# Patient Record
Sex: Male | Born: 1990 | Race: Black or African American | Hispanic: No | Marital: Single | Smoking: Never smoker
Health system: Southern US, Community
[De-identification: ages and names within clinical notes are randomized; demographics above are authoritative.]

---
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
## 2010-07-24 ENCOUNTER — Emergency Department (HOSPITAL_COMMUNITY)
Admission: EM | Admit: 2010-07-24 | Discharge: 2010-07-24 | Disposition: A | Discharge status: Home / Self Care | Payer: Self-pay | Attending: Emergency Medicine | Admitting: Emergency Medicine

## 2010-07-24 ENCOUNTER — Emergency Department (HOSPITAL_COMMUNITY): Payer: Self-pay

## 2010-07-24 DIAGNOSIS — S63056A Dislocation of other carpometacarpal joint of unspecified hand, initial encounter: Secondary | ICD-10-CM | POA: Insufficient documentation

## 2011-04-04 ENCOUNTER — Encounter (HOSPITAL_COMMUNITY): Payer: Self-pay | Admitting: *Deleted

## 2011-04-04 ENCOUNTER — Emergency Department (INDEPENDENT_AMBULATORY_CARE_PROVIDER_SITE_OTHER)
Admission: EM | Admit: 2011-04-04 | Discharge: 2011-04-04 | Disposition: A | Discharge status: Home / Self Care | Payer: PRIVATE HEALTH INSURANCE | Source: Home / Self Care

## 2011-04-04 DIAGNOSIS — K0889 Other specified disorders of teeth and supporting structures: Secondary | ICD-10-CM

## 2011-04-04 DIAGNOSIS — K089 Disorder of teeth and supporting structures, unspecified: Secondary | ICD-10-CM

## 2011-04-04 MED ORDER — HYDROCODONE-ACETAMINOPHEN 5-325 MG PO TABS: 1.0000 | ORAL_TABLET | Freq: Four times a day (QID) | ORAL | Status: DC | PRN

## 2011-04-04 MED ORDER — PENICILLIN V POTASSIUM 500 MG PO TABS: 500.0000 mg | ORAL_TABLET | Freq: Three times a day (TID) | ORAL | Status: AC

## 2011-04-04 NOTE — ED Provider Notes (Signed)
History     CSN: [id number]  Arrival date & time 04/04/11  1803   None     Chief Complaint  Patient presents with  . Dental Pain    (Consider location/radiation/quality/duration/timing/severity/associated sxs/prior treatment) HPI Comments: Pt with "cavities" in teeth for several months, getting bigger, pain is worse in last month. Cold air makes it feel better so pt frequently sucks in cold air to help relieve pain. Has not taken any meds at home to tx pain.   Patient is a 21 y.o. male presenting with tooth pain. The history is provided by the patient.  Dental PainPrimary symptoms do not include fever. Primary symptoms comment: toothache Episode onset: several months ago; worse in last month. The symptoms are worsening. The symptoms occur constantly.  Additional symptoms include: gum swelling. Additional symptoms do not include: dental sensitivity to temperature.    History reviewed. No pertinent past medical history.  History reviewed. No pertinent past surgical history.  History reviewed. No pertinent family history.  History  Substance Use Topics  . Smoking status: Never Smoker   . Smokeless tobacco: Not on file  . Alcohol Use: No      Review of Systems  Constitutional: Negative for fever and chills.  HENT: Positive for dental problem.     Allergies  Review of patient's allergies indicates no known allergies.  Home Medications   Current Outpatient Rx  Name Route Sig Dispense Refill  . HYDROCODONE-ACETAMINOPHEN 5-325 MG PO TABS Oral Take 1-2 tablets by mouth every 6 (six) hours as needed for pain. 20 tablet 0  . PENICILLIN V POTASSIUM 500 MG PO TABS Oral Take 1 tablet (500 mg total) by mouth 3 (three) times daily. 30 tablet 0    BP 129/79  Pulse 60  Temp(Src) 97.7 F (36.5 C) (Oral)  Resp 20  SpO2 99%  Physical Exam  Constitutional: He appears well-developed and well-nourished.       Uncomfortable appearing  HENT:  Mouth/Throat: No dental abscesses.      Pulmonary/Chest: Effort normal.  Lymphadenopathy:       Head (right side): No submandibular and no tonsillar adenopathy present.       Head (left side): No submandibular and no tonsillar adenopathy present.    ED Course  Procedures (including critical care time)  Labs Reviewed - No data to display No results found.   1. Pain, dental       MDM          Cathlyn Parsons, NP 04/04/11 2131

## 2011-04-04 NOTE — ED Notes (Signed)
Right upper toothache x one month worse x past week

## 2011-04-05 NOTE — ED Provider Notes (Signed)
Medical screening examination/treatment/procedure(s) were performed by non-physician practitioner and as supervising physician I was immediately available for consultation/collaboration.   Alfredo Collymore DOUGLAS MD.    Jabez Molner Douglas Elia Keenum, MD 04/05/11 1541 

## 2011-04-13 ENCOUNTER — Emergency Department (HOSPITAL_COMMUNITY)
Admission: EM | Admit: 2011-04-13 | Discharge: 2011-04-13 | Disposition: A | Discharge status: Home / Self Care | Payer: PRIVATE HEALTH INSURANCE | Attending: Emergency Medicine | Admitting: Emergency Medicine

## 2011-04-13 DIAGNOSIS — K029 Dental caries, unspecified: Secondary | ICD-10-CM | POA: Insufficient documentation

## 2011-04-13 DIAGNOSIS — K089 Disorder of teeth and supporting structures, unspecified: Secondary | ICD-10-CM | POA: Insufficient documentation

## 2011-04-13 DIAGNOSIS — K0889 Other specified disorders of teeth and supporting structures: Secondary | ICD-10-CM

## 2011-04-13 MED ORDER — PENICILLIN V POTASSIUM 125 MG/5ML PO SOLR: 500.0000 mg | Freq: Once | ORAL | Status: DC

## 2011-04-13 MED ORDER — HYDROCODONE-ACETAMINOPHEN 5-325 MG PO TABS
2.0000 | ORAL_TABLET | Freq: Once | ORAL | Status: AC
  Administered 2011-04-13: 2 via ORAL
  Filled 2011-04-13: qty 2

## 2011-04-13 MED ORDER — PENICILLIN V POTASSIUM 250 MG PO TABS
500.0000 mg | ORAL_TABLET | Freq: Once | ORAL | Status: AC
  Administered 2011-04-13: 500 mg via ORAL
  Filled 2011-04-13 (×2): qty 2

## 2011-04-13 MED ORDER — PENICILLIN V POTASSIUM 500 MG PO TABS: 500.0000 mg | ORAL_TABLET | Freq: Three times a day (TID) | ORAL | Status: AC

## 2011-04-13 MED ORDER — HYDROCODONE-ACETAMINOPHEN 5-325 MG PO TABS: 2.0000 | ORAL_TABLET | ORAL | Status: AC | PRN

## 2011-04-13 NOTE — ED Notes (Signed)
Rt. Upper  Dental pain 2 broke teeth

## 2011-04-13 NOTE — Discharge Instructions (Signed)
Dental Pain Toothache is pain in or around a tooth. It may get worse with chewing or with cold or heat.  HOME CARE  Your dentist may use a numbing medicine during treatment. If so, you may need to avoid eating until the medicine wears off. Ask your dentist about this.   Only take medicine as told by your dentist or doctor.   Avoid chewing food near the painful tooth until after all treatment is done. Ask your dentist about this.  GET HELP RIGHT AWAY IF:   The problem gets worse or new problems appear.   You have a fever.   There is redness and puffiness (swelling) of the face, jaw, or neck.   You cannot open your mouth.   There is pain in the jaw.   There is very bad pain that is not helped by medicine.  MAKE SURE YOU:   Understand these instructions.   Will watch your condition.   Will get help right away if you are not doing well or get worse.  Document Released: 07/28/2007 Document Revised: 10/21/2010 Document Reviewed: 07/28/2007 [facility] Patient Information 2012 [place], [place].  Call Dr.Knox tomorrow to scheduled next available appointment. If you call tomorrow you may be helped with the pain and for your visit

## 2011-04-13 NOTE — ED Provider Notes (Signed)
History     CSN: [id number]  Arrival date & time 04/13/11  1431   First MD Initiated Contact with Patient 04/13/11 1740      Chief Complaint  Patient presents with  . Dental Pain    (Consider location/radiation/quality/duration/timing/severity/associated sxs/prior treatment) HPI Complains of pain at right upper molar onset one month ago progressively worsening seen at [facility] treated with hydrocodone without relief using one every 6 hours; pain not met made better by anything. No past medical history on file. Negative No past surgical history on file.  No family history on file.  History  Substance Use Topics  . Smoking status: Never Smoker   . Smokeless tobacco: Not on file  . Alcohol Use: No      Review of Systems  Constitutional: Negative.   HENT: Negative.        Toothache  Respiratory: Negative.   Cardiovascular: Negative.   Gastrointestinal: Negative.   Musculoskeletal: Negative.   Skin: Negative.   Neurological: Negative.   Hematological: Negative.   Psychiatric/Behavioral: Negative.     Allergies  Review of patient's allergies indicates no known allergies.  Home Medications   Current Outpatient Rx  Name Route Sig Dispense Refill  . HYDROCODONE-ACETAMINOPHEN 5-325 MG PO TABS Oral Take 1 tablet by mouth every 6 (six) hours as needed. For pain      BP 125/74  Pulse 62  Temp(Src) 98.2 F (36.8 C) (Oral)  Resp 12  Ht 6' (1.829 m)  Wt 185 lb (83.915 kg)  BMI 25.09 kg/m2  SpO2 99%  Physical Exam  Nursing note and vitals reviewed. Constitutional: He appears well-developed and well-nourished.  HENT:  Head: Normocephalic and atraumatic.       Right upper molar badly decayed, tender no fluctuance of gingiva no trismus  Eyes: EOM are normal. Pupils are equal, round, and reactive to light.  Neck: Normal range of motion.  Pulmonary/Chest: Effort normal.  Abdominal: He exhibits no distension.  Neurological: He has normal  reflexes. No cranial nerve deficit. Coordination normal.  Skin: Skin is warm.  Psychiatric: He has a normal mood and affect.    ED Course  Procedures (including critical care time)  Labs Reviewed - No data to display No results found.   No diagnosis found.    MDM  Plan prescriptions hydrocodone-A. Pap 2 every 4 hours as needed for pain; [facility] Dental referral Dr. Lucky Cowboy Diagnosis dental carry        Doug Sou, MD 04/13/11 4098

## 2011-04-13 NOTE — ED Notes (Signed)
Pt c/o (R) upper wisdom and molar teeth pain x1 month

## 2014-08-18 ENCOUNTER — Emergency Department (HOSPITAL_COMMUNITY)
Admission: EM | Admit: 2014-08-18 | Discharge: 2014-08-18 | Disposition: A | Discharge status: Home / Self Care | Payer: PRIVATE HEALTH INSURANCE | Attending: Emergency Medicine | Admitting: Emergency Medicine

## 2014-08-18 ENCOUNTER — Encounter (HOSPITAL_COMMUNITY): Payer: Self-pay | Admitting: *Deleted

## 2014-08-18 DIAGNOSIS — K088 Other specified disorders of teeth and supporting structures: Secondary | ICD-10-CM | POA: Insufficient documentation

## 2014-08-18 DIAGNOSIS — K0889 Other specified disorders of teeth and supporting structures: Secondary | ICD-10-CM

## 2014-08-18 DIAGNOSIS — K0381 Cracked tooth: Secondary | ICD-10-CM | POA: Insufficient documentation

## 2014-08-18 MED ORDER — PENICILLIN V POTASSIUM 500 MG PO TABS: 500.0000 mg | ORAL_TABLET | Freq: Four times a day (QID) | ORAL | Status: DC

## 2014-08-18 MED ORDER — OXYCODONE-ACETAMINOPHEN 5-325 MG PO TABS
1.0000 | ORAL_TABLET | Freq: Once | ORAL | Status: AC
  Administered 2014-08-18: 1 via ORAL
  Filled 2014-08-18: qty 1

## 2014-08-18 MED ORDER — NAPROXEN 250 MG PO TABS: 250.0000 mg | ORAL_TABLET | Freq: Two times a day (BID) | ORAL | Status: DC

## 2014-08-18 NOTE — ED Notes (Signed)
Pt reports upper left dental pain x5 days, pt reports pain radiates to cheek. Pain 10/10.

## 2014-08-18 NOTE — Discharge Instructions (Signed)
Dental Pain °A tooth ache may be caused by cavities (tooth decay). Cavities expose the nerve of the tooth to air and hot or cold temperatures. It may come from an infection or abscess (also called a boil or furuncle) around your tooth. It is also often caused by dental caries (tooth decay). This causes the pain you are having. °DIAGNOSIS  °Your caregiver can diagnose this problem by exam. °TREATMENT  °· If caused by an infection, it may be treated with medications which kill germs (antibiotics) and pain medications as prescribed by your caregiver. Take medications as directed. °· Only take over-the-counter or prescription medicines for pain, discomfort, or fever as directed by your caregiver. °· Whether the tooth ache today is caused by infection or dental disease, you should see your dentist as soon as possible for further care. °SEEK MEDICAL CARE IF: °The exam and treatment you received today has been provided on an emergency basis only. This is not a substitute for complete medical or dental care. If your problem worsens or new problems (symptoms) appear, and you are unable to meet with your dentist, call or return to this location. °SEEK IMMEDIATE MEDICAL CARE IF:  °· You have a fever. °· You develop redness and swelling of your face, jaw, or neck. °· You are unable to open your mouth. °· You have severe pain uncontrolled by pain medicine. °MAKE SURE YOU:  °· Understand these instructions. °· Will watch your condition. °· Will get help right away if you are not doing well or get worse. °Document Released: 02/08/2005 Document Revised: 05/03/2011 Document Reviewed: 09/27/2007 °[employer]® Patient Information ©2015 [employer]. This information is not intended to replace advice given to you by your health care provider. Make sure you discuss any questions you have with your health care provider. ° °Dental Care and Dentist Visits °Dental care supports good overall health. Regular dental visits can also help you  avoid dental pain, bleeding, infection, and other more serious health problems in the future. It is important to keep the mouth healthy because diseases in the teeth, gums, and other oral tissues can spread to other areas of the body. Some problems, such as diabetes, heart disease, and pre-term labor have been associated with poor oral health.  °See your dentist every 6 months. If you experience emergency problems such as a toothache or broken tooth, go to the dentist right away. If you see your dentist regularly, you may catch problems early. It is easier to be treated for problems in the early stages.  °WHAT TO EXPECT AT A DENTIST VISIT  °Your dentist will look for many common oral health problems and recommend proper treatment. At your regular dental visit, you can expect: °· Gentle cleaning of the teeth and gums. This includes scraping and polishing. This helps to remove the sticky substance around the teeth and gums (plaque). Plaque forms in the mouth shortly after eating. Over time, plaque hardens on the teeth as tartar. If tartar is not removed regularly, it can cause problems. Cleaning also helps remove stains. °· Periodic X-rays. These pictures of the teeth and supporting bone will help your dentist assess the health of your teeth. °· Periodic fluoride treatments. Fluoride is a natural mineral shown to help strengthen teeth. Fluoride treatment involves applying a fluoride gel or varnish to the teeth. It is most commonly done in children. °· Examination of the mouth, tongue, jaws, teeth, and gums to look for any oral health problems, such as: °¨ Cavities (dental caries). This is   decay on the tooth caused by plaque, sugar, and acid in the mouth. It is best to catch a cavity when it is small. °¨ Inflammation of the gums caused by plaque buildup (gingivitis). °¨ Problems with the mouth or malformed or misaligned teeth. °¨ Oral cancer or other diseases of the soft tissues or jaws.  °KEEP YOUR TEETH AND GUMS  HEALTHY °For healthy teeth and gums, follow these general guidelines as well as your dentist's specific advice: °· Have your teeth professionally cleaned at the dentist every 6 months. °· Brush twice daily with a fluoride toothpaste. °· Floss your teeth daily.  °· Ask your dentist if you need fluoride supplements, treatments, or fluoride toothpaste. °· Eat a healthy diet. Reduce foods and drinks with added sugar. °· Avoid smoking. °TREATMENT FOR ORAL HEALTH PROBLEMS °If you have oral health problems, treatment varies depending on the conditions present in your teeth and gums. °· Your caregiver will most likely recommend good oral hygiene at each visit. °· For cavities, gingivitis, or other oral health disease, your caregiver will perform a procedure to treat the problem. This is typically done at a separate appointment. Sometimes your caregiver will refer you to another dental specialist for specific tooth problems or for surgery. °SEEK IMMEDIATE DENTAL CARE IF: °· You have pain, bleeding, or soreness in the gum, tooth, jaw, or mouth area. °· A permanent tooth becomes loose or separated from the gum socket. °· You experience a blow or injury to the mouth or jaw area. °Document Released: 10/21/2010 Document Revised: 05/03/2011 Document Reviewed: 10/21/2010 °[employer]® Patient Information ©2015 [employer]. This information is not intended to replace advice given to you by your health care provider. Make sure you discuss any questions you have with your health care provider. ° °

## 2014-08-18 NOTE — ED Provider Notes (Signed)
CSN: [id number]     Arrival date & time 08/18/14  0906 History   First MD Initiated Contact with Patient 08/18/14 [phone]     Chief Complaint  Patient presents with  . Dental Pain   Genevieve LEOMAR WESTBERG is a 24 y.o. male who is otherwise healthy who presents to the emergency department complaining of left upper dental pain for the past 2 weeks. The patient went to 10 out of 10 left upper dental pain that is worse with cold liquids. The patient reports he does not have a dental provider. The patient has taken nothing for treatment today. The patient denies fevers, chills, trouble swallowing, sore throat, neck pain, headache, ear pain, eye pain, or discharge from his mouth.  (Consider location/radiation/quality/duration/timing/severity/associated sxs/prior Treatment) HPI  History reviewed. No pertinent past medical history. History reviewed. No pertinent past surgical history. History reviewed. No pertinent family history. History  Substance Use Topics  . Smoking status: Never Smoker   . Smokeless tobacco: Not on file  . Alcohol Use: No    Review of Systems  Constitutional: Negative for fever and chills.  HENT: Positive for dental problem. Negative for congestion, ear pain, mouth sores, sore throat and trouble swallowing.   Eyes: Negative for pain and visual disturbance.  Respiratory: Negative for cough.   Gastrointestinal: Negative for vomiting and abdominal pain.  Musculoskeletal: Negative for neck pain and neck stiffness.  Skin: Negative for rash.  Neurological: Negative for dizziness, light-headedness and headaches.      Allergies  Review of patient's allergies indicates no known allergies.  Home Medications   Prior to Admission medications   Medication Sig Start Date End Date Taking? Authorizing Provider  HYDROcodone-acetaminophen (NORCO) 5-325 MG per tablet Take 1 tablet by mouth every 6 (six) hours as needed. For pain    Historical Provider, MD  naproxen (NAPROSYN) 250 MG  tablet Take 1 tablet (250 mg total) by mouth 2 (two) times daily with a meal. 08/18/14   Everlene Farrier, PA-C  penicillin v potassium (VEETID) 500 MG tablet Take 1 tablet (500 mg total) by mouth 4 (four) times daily. 08/18/14   Everlene Farrier, PA-C   BP 130/71 mmHg  Pulse 65  Temp(Src) 97.8 F (36.6 C) (Oral)  Resp 16  SpO2 100% Physical Exam  Constitutional: He is oriented to person, place, and time. He appears well-developed and well-nourished. No distress.  Nontoxic appearing.  HENT:  Head: Normocephalic and atraumatic.  Right Ear: External ear normal.  Left Ear: External ear normal.  Mouth/Throat: Oropharynx is clear and moist. No oropharyngeal exudate.  Patient has a left upper molar that is cracked. No evidence of abscess or induration. No facial swelling. Uvula is midline without edema. Soft palate rises symmetrically. Tongue protrusion is normal.  Eyes: Conjunctivae are normal. Pupils are equal, round, and reactive to light. Right eye exhibits no discharge. Left eye exhibits no discharge.  Neck: Normal range of motion. Neck supple. No JVD present.  Cardiovascular: Normal rate, regular rhythm, normal heart sounds and intact distal pulses.   Pulmonary/Chest: Effort normal and breath sounds normal. No respiratory distress.  Abdominal: Soft. There is no tenderness.  Lymphadenopathy:    He has no cervical adenopathy.  Neurological: He is alert and oriented to person, place, and time. Coordination normal.  Skin: Skin is warm and dry. No rash noted. He is not diaphoretic.  Psychiatric: He has a normal mood and affect. His behavior is normal.  Nursing note and vitals reviewed.   ED Course  Procedures (including critical care time) Labs Review Labs Reviewed - No data to display  Imaging Review No results found.   EKG Interpretation None      Filed Vitals:   08/18/14 0910  BP: 130/71  Pulse: 65  Temp: 97.8 F (36.6 C)  TempSrc: Oral  Resp: 16  SpO2: 100%     MDM    Meds given in ED:  Medications  oxyCODONE-acetaminophen (PERCOCET/ROXICET) 5-325 MG per tablet 1 tablet (not administered)    New Prescriptions   NAPROXEN (NAPROSYN) 250 MG TABLET    Take 1 tablet (250 mg total) by mouth 2 (two) times daily with a meal.   PENICILLIN V POTASSIUM (VEETID) 500 MG TABLET    Take 1 tablet (500 mg total) by mouth 4 (four) times daily.    Final diagnoses:  Pain, dental   Patient with left upper toothache.  No gross abscess.  Exam unconcerning for Ludwig's angina or spread of infection.  Will treat with penicillin and pain medicine.  Urged patient to follow-up with dentist Dr. Russella Dar.I advised the patient to return to the emergency department with new or worsening symptoms or new concerns. The patient verbalized understanding and agreement with plan.        Everlene Farrier, PA-C 08/18/14 5400  Raeford Razor, MD 08/19/14 [phone]

## 2016-12-11 ENCOUNTER — Emergency Department (HOSPITAL_COMMUNITY)
Admission: EM | Admit: 2016-12-11 | Discharge: 2016-12-11 | Disposition: A | Discharge status: Home / Self Care | Payer: Self-pay | Attending: Emergency Medicine | Admitting: Emergency Medicine

## 2016-12-11 ENCOUNTER — Emergency Department (HOSPITAL_COMMUNITY): Payer: Self-pay

## 2016-12-11 ENCOUNTER — Encounter (HOSPITAL_COMMUNITY): Payer: Self-pay | Admitting: Emergency Medicine

## 2016-12-11 DIAGNOSIS — R69 Illness, unspecified: Secondary | ICD-10-CM

## 2016-12-11 DIAGNOSIS — J111 Influenza due to unidentified influenza virus with other respiratory manifestations: Secondary | ICD-10-CM

## 2016-12-11 DIAGNOSIS — B9789 Other viral agents as the cause of diseases classified elsewhere: Secondary | ICD-10-CM | POA: Insufficient documentation

## 2016-12-11 DIAGNOSIS — J069 Acute upper respiratory infection, unspecified: Secondary | ICD-10-CM | POA: Insufficient documentation

## 2016-12-11 DIAGNOSIS — R6889 Other general symptoms and signs: Secondary | ICD-10-CM

## 2016-12-11 LAB — CBC WITH DIFFERENTIAL/PLATELET
BASOS ABS: 0 10*3/uL (ref 0.0–0.1)
Basophils Relative: 0 %
EOS ABS: 0.2 10*3/uL (ref 0.0–0.7)
EOS PCT: 1 %
HCT: 41.7 % (ref 39.0–52.0)
Hemoglobin: 14.3 g/dL (ref 13.0–17.0)
LYMPHS ABS: 0.7 10*3/uL (ref 0.7–4.0)
LYMPHS PCT: 5 %
MCH: 30 pg (ref 26.0–34.0)
MCHC: 34.3 g/dL (ref 30.0–36.0)
MCV: 87.4 fL (ref 78.0–100.0)
Monocytes Absolute: 1.4 10*3/uL — ABNORMAL HIGH (ref 0.1–1.0)
Monocytes Relative: 11 %
Neutro Abs: 9.9 10*3/uL — ABNORMAL HIGH (ref 1.7–7.7)
Neutrophils Relative %: 83 %
PLATELETS: 187 10*3/uL (ref 150–400)
RBC: 4.77 MIL/uL (ref 4.22–5.81)
RDW: 12.8 % (ref 11.5–15.5)
WBC: 12.2 10*3/uL — AB (ref 4.0–10.5)

## 2016-12-11 LAB — COMPREHENSIVE METABOLIC PANEL
ALK PHOS: 61 U/L (ref 38–126)
ALT: 20 U/L (ref 17–63)
AST: 34 U/L (ref 15–41)
Albumin: 4.6 g/dL (ref 3.5–5.0)
Anion gap: 13 (ref 5–15)
BUN: 9 mg/dL (ref 6–20)
CO2: 22 mmol/L (ref 22–32)
Calcium: 9.3 mg/dL (ref 8.9–10.3)
Chloride: 96 mmol/L — ABNORMAL LOW (ref 101–111)
Creatinine, Ser: 1.12 mg/dL (ref 0.61–1.24)
GFR calc Af Amer: >60 mL/min (ref >60)
Glucose, Bld: 80 mg/dL (ref 65–99)
Potassium: 3.7 mmol/L (ref 3.5–5.1)
SODIUM: 131 mmol/L — AB (ref 135–145)
Total Bilirubin: 1.3 mg/dL — ABNORMAL HIGH (ref 0.3–1.2)
Total Protein: 8.4 g/dL — ABNORMAL HIGH (ref 6.5–8.1)

## 2016-12-11 LAB — I-STAT CG4 LACTIC ACID, ED
LACTIC ACID, VENOUS: 2.43 mmol/L — AB (ref 0.5–1.9)
Lactic Acid, Venous: 2.29 mmol/L (ref 0.5–1.9)

## 2016-12-11 LAB — PROTIME-INR
INR: 1.07
PROTHROMBIN TIME: 13.8 s (ref 11.4–15.2)

## 2016-12-11 MED ORDER — ACETAMINOPHEN 500 MG PO TABS
1000.0000 mg | ORAL_TABLET | Freq: Once | ORAL | Status: AC
  Administered 2016-12-11: 1000 mg via ORAL
  Filled 2016-12-11: qty 2

## 2016-12-11 MED ORDER — IBUPROFEN 800 MG PO TABS: 800.0000 mg | ORAL_TABLET | Freq: Three times a day (TID) | ORAL | 0 refills | Status: DC | PRN

## 2016-12-11 MED ORDER — SODIUM CHLORIDE 0.9 % IV BOLUS (SEPSIS)
1000.0000 mL | Freq: Once | INTRAVENOUS | Status: AC
  Administered 2016-12-11: 1000 mL via INTRAVENOUS

## 2016-12-11 MED ORDER — IBUPROFEN 800 MG PO TABS
800.0000 mg | ORAL_TABLET | Freq: Once | ORAL | Status: AC
  Administered 2016-12-11: 800 mg via ORAL
  Filled 2016-12-11: qty 1

## 2016-12-11 MED ORDER — SODIUM CHLORIDE 0.9 % IV BOLUS (SEPSIS)
2000.0000 mL | Freq: Once | INTRAVENOUS | Status: AC
  Administered 2016-12-11: 2000 mL via INTRAVENOUS

## 2016-12-11 MED ORDER — GUAIFENESIN ER 1200 MG PO TB12: 1.0000 | ORAL_TABLET | Freq: Two times a day (BID) | ORAL | 0 refills | Status: DC

## 2016-12-11 MED ORDER — PROMETHAZINE-DM 6.25-15 MG/5ML PO SYRP: 5.0000 mL | ORAL_SOLUTION | Freq: Four times a day (QID) | ORAL | 0 refills | Status: DC | PRN

## 2016-12-11 NOTE — Discharge Instructions (Signed)
Return here as needed.  Increase your fluid intake.  Rest as much as possible. °

## 2016-12-11 NOTE — ED Notes (Signed)
Pt understood dc material. NAD noted. Scripts given at dc 

## 2016-12-11 NOTE — ED Triage Notes (Signed)
Pt. Stated, I've had a cough, headache, and body aches for 2-3 days. I just feel bad.

## 2016-12-11 NOTE — ED Notes (Signed)
Pt is in xray

## 2016-12-12 NOTE — ED Provider Notes (Signed)
[facility] Provider Note   CSN: [id number] Arrival date & time: 12/11/16  1440     History   Chief Complaint Chief Complaint  Patient presents with  . Cough  . Generalized Body Aches    HPI Sean Stafford is a 26 y.o. male.  HPI Patient presents to the emergency department with cough and body aches, sore throat, runny nose, with nausea and vomiting over the last 2 days.  Patient states that nothing seems make the condition better or worse.  The patient states he did not take any medications prior to arrival.  Patient states that he was exposed to illness by his girlfriend. The patient denies chest pain, shortness of breath, headache,blurred vision, neck pain, weakness, numbness, dizziness, anorexia, edema, abdominal pain diarrhea, rash, back pain, dysuria, hematemesis, bloody stool, near syncope, or syncope. History reviewed. No pertinent past medical history.  There are no active problems to display for this patient.   History reviewed. No pertinent surgical history.     Home Medications    Prior to Admission medications   Medication Sig Start Date End Date Taking? Authorizing Provider  Guaifenesin 1200 MG TB12 Take 1 tablet (1,200 mg total) by mouth 2 (two) times daily. 12/11/16   Sharel Behne, Cristal Deerhristopher, PA-C  ibuprofen (ADVIL,MOTRIN) 800 MG tablet Take 1 tablet (800 mg total) by mouth every 8 (eight) hours as needed. 12/11/16   Vyolet Sakuma, Cristal Deerhristopher, PA-C  naproxen (NAPROSYN) 250 MG tablet Take 1 tablet (250 mg total) by mouth 2 (two) times daily with a meal. Patient not taking: Reported on 12/11/2016 08/18/14   Everlene Farrieransie, William, PA-C  promethazine-dextromethorphan (PROMETHAZINE-DM) 6.25-15 MG/5ML syrup Take 5 mLs by mouth 4 (four) times daily as needed for cough. 12/11/16   Charlestine NightLawyer, Zaquan Duffner, PA-C    Family History No family history on file.  Social History Social History  Substance Use Topics  . Smoking status: Never Smoker   . Smokeless tobacco: Never Used  . Alcohol use No     Allergies   Patient has no known allergies.   Review of Systems Review of Systems All other systems negative except as documented in the HPI. All pertinent positives and negatives as reviewed in the HPI.  Physical Exam Updated Vital Signs BP 103/61   Pulse 67   Temp 100.3 F (37.9 C) (Oral)   Resp 17   Ht 6\' 1"  (1.854 m)   Wt 84.4 kg (186 lb)   SpO2 98%   BMI 24.54 kg/m   Physical Exam  Constitutional: He is oriented to person, place, and time. He appears well-developed and well-nourished. No distress.  HENT:  Head: Normocephalic and atraumatic.  Mouth/Throat: Oropharynx is clear and moist.  Eyes: Pupils are equal, round, and reactive to light.  Neck: Normal range of motion. Neck supple.  Cardiovascular: Normal rate, regular rhythm and normal heart sounds.  Exam reveals no gallop and no friction rub.   No murmur heard. Pulmonary/Chest: Effort normal and breath sounds normal. No respiratory distress. He has no wheezes.  Abdominal: Soft. Bowel sounds are normal. He exhibits no distension. There is no tenderness.  Neurological: He is alert and oriented to person, place, and time. He exhibits normal muscle tone. Coordination normal.  Skin: Skin is warm and dry. Capillary refill takes less than 2 seconds. No rash noted. No erythema.  Psychiatric: He has a normal mood and affect. His behavior is normal.  Nursing note and vitals reviewed.    ED Treatments / Results  Labs (all labs ordered are listed, but only abnormal results are displayed) Labs Reviewed  COMPREHENSIVE METABOLIC PANEL - Abnormal; Notable for the following:       Result Value   Sodium 131 (*)    Chloride 96 (*)    Total Protein 8.4 (*)    Total Bilirubin 1.3 (*)    All other components within normal limits  CBC WITH DIFFERENTIAL/PLATELET - Abnormal; Notable for the following:    WBC 12.2 (*)    Neutro Abs 9.9 (*)    Monocytes Absolute 1.4 (*)      All other components within normal limits  I-STAT CG4 LACTIC ACID, ED - Abnormal; Notable for the following:    Lactic Acid, Venous 2.29 (*)    All other components within normal limits  I-STAT CG4 LACTIC ACID, ED - Abnormal; Notable for the following:    Lactic Acid, Venous 2.43 (*)    All other components within normal limits  PROTIME-INR    EKG  EKG Interpretation None       Radiology Dg Chest 2 View  Result Date: 12/11/2016 CLINICAL DATA:  Fever, body aches and weakness for 3 days. EXAM: CHEST  2 VIEW COMPARISON:  None. FINDINGS: The cardiomediastinal silhouette is unremarkable. There is no evidence of focal airspace disease, pulmonary edema, suspicious pulmonary nodule/mass, pleural effusion, or pneumothorax. No acute bony abnormalities are identified. IMPRESSION: No active cardiopulmonary disease. Electronically Signed   By: Harmon Pier M.D.   On: 12/11/2016 16:08    Procedures Procedures (including critical care time)  Medications Ordered in ED Medications  sodium chloride 0.9 % bolus 2,000 mL (0 mLs Intravenous Stopped 12/11/16 1845)  acetaminophen (TYLENOL) tablet 1,000 mg (1,000 mg Oral Given 12/11/16 1623)  ibuprofen (ADVIL,MOTRIN) tablet 800 mg (800 mg Oral Given 12/11/16 1623)  sodium chloride 0.9 % bolus 1,000 mL (0 mLs Intravenous Stopped 12/11/16 2007)     Initial Impression / Assessment and Plan / ED Course  I have reviewed the triage vital signs and the nursing notes.  Pertinent labs & imaging results that were available during my care of the patient were reviewed by me and considered in my medical decision making (see chart for details).     The patient will be treated for flulike illness.  Told to return here as needed.  Patient is feeling dramatically better following IV fluids, Tylenol and Motrin.  Patient is advised to return for any worsening in his condition.  Patient agrees the plan and all questions were answered.  I think his lactic acidosis  due to dehydration, which he received 3 L of fluid.  He is feeling remarkably better  Final Clinical Impressions(s) / ED Diagnoses   Final diagnoses:  Viral URI with cough  Influenza-like illness  Flu-like symptoms    New Prescriptions Discharge Medication List as of 12/11/2016  8:35 PM    START taking these medications   Details  Guaifenesin 1200 MG TB12 Take 1 tablet (1,200 mg total) by mouth 2 (two) times daily., Starting Sat 12/11/2016, Print    ibuprofen (ADVIL,MOTRIN) 800 MG tablet Take 1 tablet (800 mg total) by mouth every 8 (eight) hours as needed., Starting Sat 12/11/2016, Print    promethazine-dextromethorphan (PROMETHAZINE-DM) 6.25-15 MG/5ML syrup Take 5 mLs by mouth 4 (four) times daily as needed for cough., Starting Sat 12/11/2016, Print         Earlie Arciga, [place], PA-C 12/12/16 0101    Tegeler, Canary Brim, MD 12/12/16 [id number]

## 2017-02-08 ENCOUNTER — Encounter (HOSPITAL_COMMUNITY): Payer: Self-pay | Admitting: Emergency Medicine

## 2017-02-08 ENCOUNTER — Emergency Department (HOSPITAL_COMMUNITY)
Admission: EM | Admit: 2017-02-08 | Discharge: 2017-02-08 | Disposition: A | Discharge status: Home / Self Care | Payer: PRIVATE HEALTH INSURANCE | Attending: Emergency Medicine | Admitting: Emergency Medicine

## 2017-02-08 ENCOUNTER — Other Ambulatory Visit: Payer: Self-pay

## 2017-02-08 DIAGNOSIS — Z5321 Procedure and treatment not carried out due to patient leaving prior to being seen by health care provider: Secondary | ICD-10-CM | POA: Insufficient documentation

## 2017-02-08 DIAGNOSIS — R51 Headache: Secondary | ICD-10-CM | POA: Insufficient documentation

## 2017-02-08 DIAGNOSIS — R040 Epistaxis: Secondary | ICD-10-CM | POA: Insufficient documentation

## 2017-02-08 NOTE — ED Notes (Signed)
Patient up to desk, states he would like to check out, states he needs to leave.  This RN reviewed return precautions.  Will d/c.

## 2017-02-08 NOTE — ED Triage Notes (Signed)
Pt reports frequent nosebleeds and migraines.  Last nosebleed was an hour ago and last migraine was "I think yesterday."  Pt states he was concerned about his blood pressure. 125/89.  Pt reports smoking marijuana recently.

## 2017-05-03 ENCOUNTER — Other Ambulatory Visit: Payer: Self-pay

## 2017-05-03 ENCOUNTER — Emergency Department (HOSPITAL_COMMUNITY)
Admission: EM | Admit: 2017-05-03 | Discharge: 2017-05-03 | Disposition: A | Discharge status: Home / Self Care | Payer: No Typology Code available for payment source | Attending: Emergency Medicine | Admitting: Emergency Medicine

## 2017-05-03 ENCOUNTER — Encounter (HOSPITAL_COMMUNITY): Payer: Self-pay | Admitting: *Deleted

## 2017-05-03 DIAGNOSIS — Z79899 Other long term (current) drug therapy: Secondary | ICD-10-CM | POA: Insufficient documentation

## 2017-05-03 DIAGNOSIS — S3992XA Unspecified injury of lower back, initial encounter: Secondary | ICD-10-CM | POA: Insufficient documentation

## 2017-05-03 DIAGNOSIS — Y92410 Unspecified street and highway as the place of occurrence of the external cause: Secondary | ICD-10-CM | POA: Diagnosis not present

## 2017-05-03 DIAGNOSIS — Y999 Unspecified external cause status: Secondary | ICD-10-CM | POA: Insufficient documentation

## 2017-05-03 DIAGNOSIS — Y939 Activity, unspecified: Secondary | ICD-10-CM | POA: Insufficient documentation

## 2017-05-03 MED ORDER — NAPROXEN 500 MG PO TABS: 500.0000 mg | ORAL_TABLET | Freq: Two times a day (BID) | ORAL | 0 refills | Status: DC

## 2017-05-03 MED ORDER — METHOCARBAMOL 500 MG PO TABS: 500.0000 mg | ORAL_TABLET | Freq: Three times a day (TID) | ORAL | 0 refills | Status: DC | PRN
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
## 2019-11-23 ENCOUNTER — Encounter (HOSPITAL_COMMUNITY): Payer: Self-pay

## 2019-11-23 ENCOUNTER — Emergency Department (HOSPITAL_COMMUNITY)
Admission: EM | Admit: 2019-11-23 | Discharge: 2019-11-23 | Disposition: A | Discharge status: Home / Self Care | Payer: Self-pay | Attending: Emergency Medicine | Admitting: Emergency Medicine

## 2019-11-23 ENCOUNTER — Other Ambulatory Visit: Payer: Self-pay

## 2019-11-23 DIAGNOSIS — Y92410 Unspecified street and highway as the place of occurrence of the external cause: Secondary | ICD-10-CM | POA: Insufficient documentation

## 2019-11-23 DIAGNOSIS — M25512 Pain in left shoulder: Secondary | ICD-10-CM | POA: Insufficient documentation

## 2019-11-23 DIAGNOSIS — M545 Low back pain, unspecified: Secondary | ICD-10-CM | POA: Insufficient documentation

## 2019-11-23 DIAGNOSIS — Z5321 Procedure and treatment not carried out due to patient leaving prior to being seen by health care provider: Secondary | ICD-10-CM | POA: Insufficient documentation

## 2019-11-23 NOTE — ED Notes (Signed)
No answer when called for room x 2 
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
## 2019-11-25 ENCOUNTER — Other Ambulatory Visit: Payer: Self-pay

## 2019-11-25 ENCOUNTER — Emergency Department (HOSPITAL_COMMUNITY): Payer: Self-pay

## 2019-11-25 ENCOUNTER — Emergency Department (HOSPITAL_COMMUNITY)
Admission: EM | Admit: 2019-11-25 | Discharge: 2019-11-25 | Disposition: A | Discharge status: Home / Self Care | Payer: Self-pay | Attending: Emergency Medicine | Admitting: Emergency Medicine

## 2019-11-25 ENCOUNTER — Encounter (HOSPITAL_COMMUNITY): Payer: Self-pay | Admitting: Emergency Medicine

## 2019-11-25 DIAGNOSIS — Y92410 Unspecified street and highway as the place of occurrence of the external cause: Secondary | ICD-10-CM | POA: Diagnosis not present

## 2019-11-25 DIAGNOSIS — M546 Pain in thoracic spine: Secondary | ICD-10-CM | POA: Diagnosis not present

## 2019-11-25 DIAGNOSIS — M5459 Other low back pain: Secondary | ICD-10-CM | POA: Diagnosis not present

## 2019-11-25 MED ORDER — IBUPROFEN 400 MG PO TABS: 400.0000 mg | ORAL_TABLET | Freq: Three times a day (TID) | ORAL | 0 refills | Status: AC

## 2019-11-25 MED ORDER — TIZANIDINE HCL 2 MG PO CAPS: 2.0000 mg | ORAL_CAPSULE | Freq: Three times a day (TID) | ORAL | 0 refills | Status: AC
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
## 2020-12-05 ENCOUNTER — Emergency Department (HOSPITAL_COMMUNITY)
Admission: EM | Admit: 2020-12-05 | Discharge: 2020-12-06 | Disposition: A | Discharge status: Home / Self Care | Payer: Self-pay | Attending: Emergency Medicine | Admitting: Emergency Medicine

## 2020-12-05 ENCOUNTER — Encounter (HOSPITAL_COMMUNITY): Payer: Self-pay | Admitting: Emergency Medicine

## 2020-12-05 ENCOUNTER — Other Ambulatory Visit: Payer: Self-pay

## 2020-12-05 DIAGNOSIS — Z23 Encounter for immunization: Secondary | ICD-10-CM | POA: Insufficient documentation

## 2020-12-05 DIAGNOSIS — H00011 Hordeolum externum right upper eyelid: Secondary | ICD-10-CM

## 2020-12-05 DIAGNOSIS — W540XXA Bitten by dog, initial encounter: Secondary | ICD-10-CM

## 2020-12-05 DIAGNOSIS — S61531A Puncture wound without foreign body of right wrist, initial encounter: Secondary | ICD-10-CM | POA: Insufficient documentation

## 2020-12-05 DIAGNOSIS — H5711 Ocular pain, right eye: Secondary | ICD-10-CM | POA: Insufficient documentation

## 2020-12-05 NOTE — ED Provider Notes (Signed)
Emergency Medicine Provider Triage Evaluation Note  Sean Stafford , a 30 y.o. male  was evaluated in triage.  Pt complains of dog bite, stye.  Review of Systems  Positive: R stye, dog bite R wrist Negative: Fever, numbness, vision changes  Physical Exam  There were no vitals taken for this visit. Gen:   Awake, no distress   Resp:  Normal effort  MSK:   Moves extremities without difficulty  Other:    Medical Decision Making  Medically screening exam initiated at 7:40 PM.  Appropriate orders placed.  Sean Stafford was informed that the remainder of the evaluation will be completed by another provider, this initial triage assessment does not replace that evaluation, and the importance of remaining in the ED until their evaluation is complete.  Pt has a nontender nodule to R upper eyelid x 2 weeks, requesting treatment.  Also had a provoked dog bite to R wrist yesterday.  His dog, it's UTD with rabies.  He's UTD with tdap.    Fayrene Helper, PA-C 12/05/20 1943    Terrilee Files, MD 12/05/20 [phone]

## 2020-12-05 NOTE — ED Triage Notes (Signed)
Pt c/o a rt eye stye and a dog bite rt forearm  see the pas for particulars  he saw before myself

## 2020-12-06 ENCOUNTER — Encounter (HOSPITAL_COMMUNITY): Payer: Self-pay | Admitting: Emergency Medicine

## 2020-12-06 MED ORDER — TETANUS-DIPHTH-ACELL PERTUSSIS 5-2.5-18.5 LF-MCG/0.5 IM SUSY
0.5000 mL | PREFILLED_SYRINGE | Freq: Once | INTRAMUSCULAR | Status: AC
  Administered 2020-12-06: 0.5 mL via INTRAMUSCULAR
  Filled 2020-12-06: qty 0.5

## 2020-12-06 MED ORDER — ERYTHROMYCIN 5 MG/GM OP OINT
1.0000 "application " | TOPICAL_OINTMENT | Freq: Once | OPHTHALMIC | Status: AC
  Administered 2020-12-06: 1 via OPHTHALMIC
  Filled 2020-12-06: qty 3.5

## 2020-12-06 MED ORDER — AMOXICILLIN-POT CLAVULANATE 875-125 MG PO TABS: 1.0000 | ORAL_TABLET | Freq: Two times a day (BID) | ORAL | 0 refills | Status: AC

## 2020-12-06 MED ORDER — AMOXICILLIN-POT CLAVULANATE 875-125 MG PO TABS
1.0000 | ORAL_TABLET | Freq: Once | ORAL | Status: AC
  Administered 2020-12-06: 1 via ORAL
  Filled 2020-12-06: qty 1
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
## 2020-12-06 NOTE — ED Notes (Signed)
Pt refused to have vitals checked   

## 2020-12-06 NOTE — ED Notes (Signed)
Pt stated " I do not want to keep checking my vitals nothing has changed."
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
## 2022-07-26 ENCOUNTER — Emergency Department (HOSPITAL_COMMUNITY)
Admission: EM | Admit: 2022-07-26 | Discharge: 2022-07-27 | Discharge status: Against Medical Advice | Payer: No Typology Code available for payment source | Attending: Emergency Medicine | Admitting: Emergency Medicine

## 2022-07-26 ENCOUNTER — Encounter (HOSPITAL_COMMUNITY): Payer: Self-pay

## 2022-07-26 ENCOUNTER — Other Ambulatory Visit: Payer: Self-pay

## 2022-07-26 ENCOUNTER — Emergency Department (HOSPITAL_COMMUNITY): Payer: No Typology Code available for payment source

## 2022-07-26 DIAGNOSIS — Z5321 Procedure and treatment not carried out due to patient leaving prior to being seen by health care provider: Secondary | ICD-10-CM | POA: Insufficient documentation

## 2022-07-26 DIAGNOSIS — M546 Pain in thoracic spine: Secondary | ICD-10-CM | POA: Diagnosis present
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
## 2022-07-26 NOTE — ED Notes (Signed)
Pt called for vitals recheck X3. Pt could not be found.

## 2022-07-27 NOTE — ED Notes (Signed)
Pt called X2. Pt could not be found.  ?

## 2022-09-28 ENCOUNTER — Encounter (HOSPITAL_COMMUNITY): Payer: Self-pay

## 2022-09-28 ENCOUNTER — Emergency Department (HOSPITAL_COMMUNITY)
Admission: EM | Admit: 2022-09-28 | Discharge: 2022-09-28 | Disposition: A | Discharge status: Home / Self Care | Payer: Self-pay | Attending: Emergency Medicine | Admitting: Emergency Medicine

## 2022-09-28 ENCOUNTER — Other Ambulatory Visit: Payer: Self-pay

## 2022-09-28 DIAGNOSIS — L03314 Cellulitis of groin: Secondary | ICD-10-CM | POA: Insufficient documentation

## 2022-09-28 MED ORDER — BACITRACIN ZINC 500 UNIT/GM EX OINT: TOPICAL_OINTMENT | Freq: Two times a day (BID) | CUTANEOUS | Status: DC

## 2022-09-28 MED ORDER — BACITRACIN ZINC 500 UNIT/GM EX OINT: 1.0000 | TOPICAL_OINTMENT | Freq: Two times a day (BID) | CUTANEOUS | 0 refills | Status: AC

## 2022-09-28 MED ORDER — DOXYCYCLINE HYCLATE 100 MG PO TABS
100.0000 mg | ORAL_TABLET | Freq: Once | ORAL | Status: AC
  Administered 2022-09-28: 100 mg via ORAL
  Filled 2022-09-28: qty 1

## 2022-09-28 MED ORDER — DOXYCYCLINE HYCLATE 100 MG PO CAPS: 100.0000 mg | ORAL_CAPSULE | Freq: Two times a day (BID) | ORAL | 0 refills | Status: DC

## 2022-09-28 NOTE — Discharge Instructions (Signed)
As we discussed, you have groin cellulitis.  I recommend you take doxycycline twice daily for a week  Please apply bacitracin to your groin  See your doctor for follow-up   Return to ER if you have worse groin swelling or testicular pain or purulent discharge

## 2022-09-28 NOTE — ED Provider Notes (Signed)
[place] EMERGENCY DEPARTMENT AT [facility] Provider Note   CSN: [id number] Arrival date & time: 09/28/22  1552     History  Chief Complaint  Patient presents with   Groin Pain    Sean Stafford is a 32 y.o. male who presented with groin pain.  Patient states that he has been having pain in between his anus and his testicle for a while now.  He states that it is painful when he sits.  Denies any anal intercourse.  Patient denies any penile discharge but he is sexually active.  Patient denies any testicular pain.  The history is provided by the patient.       Home Medications Prior to Admission medications   Medication Sig Start Date End Date Taking? Authorizing Provider  bacitracin ointment Apply 1 Application topically 2 (two) times daily. 09/28/22  Yes Charlynne Pander, MD  doxycycline (VIBRAMYCIN) 100 MG capsule Take 1 capsule (100 mg total) by mouth 2 (two) times daily. One po bid x 7 days 09/28/22  Yes Charlynne Pander, MD  amoxicillin-clavulanate (AUGMENTIN) 875-125 MG tablet Take 1 tablet by mouth every 12 (twelve) hours. 12/06/20   Palumbo, April, MD  Guaifenesin 1200 MG TB12 Take 1 tablet (1,200 mg total) by mouth 2 (two) times daily. 12/11/16   Lawyer, Cristal Deer, PA-C  promethazine-dextromethorphan (PROMETHAZINE-DM) 6.25-15 MG/5ML syrup Take 5 mLs by mouth 4 (four) times daily as needed for cough. 12/11/16   Charlestine Night, PA-C      Allergies    Patient has no known allergies.    Review of Systems   Review of Systems  Genitourinary:        Groin pain  All other systems reviewed and are negative.   Physical Exam Updated Vital Signs BP 124/77 (BP Location: Right Arm)   Pulse 97   Temp 98.7 F (37.1 C) (Oral)   Resp 16   Ht 6\' 1"  (1.854 m)   Wt 74.8 kg   SpO2 98%   BMI 21.77 kg/m  Physical Exam Vitals reviewed.  Eyes:     Pupils: Pupils are equal, round, and reactive to light.  Cardiovascular:     Rate and Rhythm: Normal rate.      Pulses: Normal pulses.  Pulmonary:     Effort: Pulmonary effort is normal.  Genitourinary:    Comments: Rectal-no obvious anal fissure or hemorrhoids.  Patient does have an area of erythema between the rectum and the testicle.  Patient testicles are nontender.  There is no sign of Fournier's gangrene. Musculoskeletal:     Cervical back: Normal range of motion.  Skin:    General: Skin is warm.     Capillary Refill: Capillary refill takes less than 2 seconds.  Neurological:     General: No focal deficit present.     Mental Status: He is oriented to person, place, and time.  Psychiatric:        Mood and Affect: Mood normal.        Behavior: Behavior normal.     ED Results / Procedures / Treatments   Labs (all labs ordered are listed, but only abnormal results are displayed) Labs Reviewed  GC/CHLAMYDIA PROBE AMP ([place]) NOT AT [facility]    EKG None  Radiology No results found.  Procedures Procedures    Medications Ordered in ED Medications  bacitracin ointment (has no administration in time range)  doxycycline (VIBRA-TABS) tablet 100 mg (100 mg Oral Given 09/28/22 1626)    ED  Course/ Medical Decision Making/ A&P                                 Medical Decision Making Sean Stafford is a 32 y.o. male here with groin cellulitis.  I do not see any signs of fistula or abscess.  Patient has no involvement with the anus or scrotum.  Patient does not have any signs of testicular torsion.  I think he just has a simple groin cellulitis.  I have prescribed doxycycline.  Patient request STD testing so I will send off GC chlamydia.  Patient adamantly denies anal intercourse and has no signs of anal fissure. Stable for discharge    Problems Addressed: Cellulitis of groin: acute illness or injury  Risk OTC drugs. Prescription drug management.    Final Clinical Impression(s) / ED Diagnoses Final diagnoses:  Cellulitis of groin    Rx / DC Orders ED Discharge  Orders          Ordered    doxycycline (VIBRAMYCIN) 100 MG capsule  2 times daily        09/28/22 1632    bacitracin ointment  2 times daily        09/28/22 1632              Charlynne Pander, MD 09/28/22 [phone]

## 2022-09-28 NOTE — ED Triage Notes (Signed)
Pt reports aching pain to perineum (between my testcles and rectum, onset about 2 days ago. Pain is worse when sitting. He feels pressure around his rectum when he hits. Denies penile swelling or discharge, no hematuria or dysuria.

## 2022-12-21 ENCOUNTER — Emergency Department (HOSPITAL_COMMUNITY)
Admission: EM | Admit: 2022-12-21 | Discharge: 2022-12-21 | Discharge status: Against Medical Advice | Payer: Self-pay | Attending: Emergency Medicine | Admitting: Emergency Medicine

## 2022-12-21 ENCOUNTER — Encounter (HOSPITAL_COMMUNITY): Payer: Self-pay | Admitting: Pharmacy Technician

## 2022-12-21 ENCOUNTER — Other Ambulatory Visit: Payer: Self-pay

## 2022-12-21 DIAGNOSIS — N5089 Other specified disorders of the male genital organs: Secondary | ICD-10-CM | POA: Insufficient documentation

## 2022-12-21 DIAGNOSIS — Z5321 Procedure and treatment not carried out due to patient leaving prior to being seen by health care provider: Secondary | ICD-10-CM | POA: Insufficient documentation

## 2022-12-21 NOTE — ED Notes (Signed)
Pt left without being seen , called pt for placement and got no response

## 2022-12-21 NOTE — ED Triage Notes (Signed)
Pt here with concern for spread of cellulitis to his testicles. Seen here approx 3 weeks ago and dx with cellulitis around his anus, prescribed abx. Approx 1 week ago pt states pain under scrotum.

## 2022-12-26 ENCOUNTER — Emergency Department (HOSPITAL_COMMUNITY): Payer: Self-pay

## 2022-12-26 ENCOUNTER — Emergency Department (HOSPITAL_COMMUNITY)
Admission: EM | Admit: 2022-12-26 | Discharge: 2022-12-26 | Discharge status: Against Medical Advice | Payer: Self-pay | Attending: Emergency Medicine | Admitting: Emergency Medicine

## 2022-12-26 ENCOUNTER — Encounter (HOSPITAL_COMMUNITY): Payer: Self-pay

## 2022-12-26 DIAGNOSIS — N50811 Right testicular pain: Secondary | ICD-10-CM | POA: Insufficient documentation

## 2022-12-26 DIAGNOSIS — N50812 Left testicular pain: Secondary | ICD-10-CM | POA: Insufficient documentation

## 2022-12-26 DIAGNOSIS — Z5329 Procedure and treatment not carried out because of patient's decision for other reasons: Secondary | ICD-10-CM | POA: Insufficient documentation

## 2022-12-26 DIAGNOSIS — Z711 Person with feared health complaint in whom no diagnosis is made: Secondary | ICD-10-CM | POA: Insufficient documentation

## 2022-12-26 NOTE — ED Provider Notes (Signed)
[place] EMERGENCY DEPARTMENT AT [facility] Provider Note   CSN: [id number] Arrival date & time: 12/26/22  1524     History {Add pertinent medical, surgical, social history, OB history to HPI:1} Chief Complaint  Patient presents with   Testicle Pain    Sean Stafford is a 32 y.o. male.  32 year old male who presents emergency department for evaluation of rash and testicle pain.  Patient reports that he was seen about a month ago and was diagnosed with cellulitis of the perineum.  He took an antibiotic and states that it got better and he is also been using antifungal spray for prevention of docket.  He denies any itching.  He denies penile discharge.  He has not been sexually active in months.  He was tested for STIs at his last visit.  The history is provided by the patient and medical records.  Testicle Pain This is a chronic problem. The current episode started more than 1 week ago. The problem occurs constantly. The problem has not changed since onset.Pertinent negatives include no chest pain, no abdominal pain, no headaches and no shortness of breath. Nothing aggravates the symptoms. Nothing relieves the symptoms. He has tried nothing for the symptoms.       Home Medications Prior to Admission medications   Medication Sig Start Date End Date Taking? Authorizing Provider  amoxicillin-clavulanate (AUGMENTIN) 875-125 MG tablet Take 1 tablet by mouth every 12 (twelve) hours. 12/06/20   Palumbo, April, MD  bacitracin ointment Apply 1 Application topically 2 (two) times daily. 09/28/22   Charlynne Pander, MD  doxycycline (VIBRAMYCIN) 100 MG capsule Take 1 capsule (100 mg total) by mouth 2 (two) times daily. One po bid x 7 days 09/28/22   Charlynne Pander, MD  Guaifenesin 1200 MG TB12 Take 1 tablet (1,200 mg total) by mouth 2 (two) times daily. 12/11/16   Lawyer, Cristal Deer, PA-C  promethazine-dextromethorphan (PROMETHAZINE-DM) 6.25-15 MG/5ML syrup Take 5 mLs by mouth  4 (four) times daily as needed for cough. 12/11/16   Charlestine Night, PA-C      Allergies    Patient has no known allergies.    Review of Systems   Review of Systems  Respiratory:  Negative for shortness of breath.   Cardiovascular:  Negative for chest pain.  Gastrointestinal:  Negative for abdominal pain.  Genitourinary:  Positive for testicular pain.  Neurological:  Negative for headaches.    Physical Exam Updated Vital Signs BP 128/88 (BP Location: Right Arm)   Pulse 75   Temp 98.5 F (36.9 C)   Resp 15   Ht 6\' 1"  (1.854 m)   Wt 74.8 kg   SpO2 100%   BMI 21.76 kg/m  Physical Exam Vitals and nursing note reviewed. Exam conducted with a chaperone present Laurelyn Sickle Ma).  Constitutional:      General: He is not in acute distress.    Appearance: He is well-developed. He is not diaphoretic.  HENT:     Head: Normocephalic and atraumatic.  Eyes:     General: No scleral icterus.    Conjunctiva/sclera: Conjunctivae normal.  Cardiovascular:     Rate and Rhythm: Normal rate and regular rhythm.     Heart sounds: Normal heart sounds.  Pulmonary:     Effort: Pulmonary effort is normal. No respiratory distress.     Breath sounds: Normal breath sounds.  Abdominal:     Palpations: Abdomen is soft.     Tenderness: There is no abdominal tenderness.  Genitourinary:  Pubic Area: No rash.      Penis: Normal and circumcised. No erythema, tenderness, discharge, swelling or lesions.      Testes: Normal. Cremasteric reflex is present.        Right: Mass or tenderness not present.        Left: Mass or tenderness not present.     Epididymis:     Right: Normal.     Left: Normal.     Comments: Normal male amount anatomy, circumcised penis.  Pearly papules noted inferior to the glans.  Otherwise normal myelinated penile shaft and scrotum.  No rashes present.  Testicles are nontender and nonswollen Musculoskeletal:     Cervical back: Normal range of motion and neck supple.  Skin:     General: Skin is warm and dry.  Neurological:     Mental Status: He is alert.  Psychiatric:        Behavior: Behavior normal.     ED Results / Procedures / Treatments   Labs (all labs ordered are listed, but only abnormal results are displayed) Labs Reviewed - No data to display  EKG None  Radiology No results found.  Procedures Procedures  {Document cardiac monitor, telemetry assessment procedure when appropriate:1}  Medications Ordered in ED Medications - No data to display  ED Course/ Medical Decision Making/ A&P   {   Click here for ABCD2, HEART and other calculatorsREFRESH Note before signing :1}                              Medical Decision Making Amount and/or Complexity of Data Reviewed Radiology: ordered.   ***  {Document critical care time when appropriate:1} {Document review of labs and clinical decision tools ie heart score, Chads2Vasc2 etc:1}  {Document your independent review of radiology images, and any outside records:1} {Document your discussion with family members, caretakers, and with consultants:1} {Document social determinants of health affecting pt's care:1} {Document your decision making why or why not admission, treatments were needed:1} Final Clinical Impression(s) / ED Diagnoses Final diagnoses:  None    Rx / DC Orders ED Discharge Orders     None

## 2022-12-26 NOTE — ED Triage Notes (Signed)
Seen here on 10/29 for cellulitis to his testicles and was on antibiotics. Also, seen here 4 weeks ago for cellulitis to his anus. Pt is here today as pt states, "Now I have the same pain and skin irritation to my scrotum and penis." Denies drainage. No fever/chills.

## 2022-12-26 NOTE — ED Notes (Signed)
Pt refused the scrotal ultrasound. States he has to go as he has somewhere to be. Signed out AMA. Will notify provider.

## 2022-12-29 ENCOUNTER — Other Ambulatory Visit: Payer: Self-pay

## 2022-12-29 ENCOUNTER — Emergency Department (HOSPITAL_COMMUNITY)
Admission: EM | Admit: 2022-12-29 | Discharge: 2022-12-29 | Discharge status: Against Medical Advice | Payer: Self-pay | Attending: Emergency Medicine | Admitting: Emergency Medicine

## 2022-12-29 ENCOUNTER — Encounter (HOSPITAL_COMMUNITY): Payer: Self-pay | Admitting: *Deleted

## 2022-12-29 DIAGNOSIS — N50812 Left testicular pain: Secondary | ICD-10-CM | POA: Insufficient documentation

## 2022-12-29 DIAGNOSIS — Z5321 Procedure and treatment not carried out due to patient leaving prior to being seen by health care provider: Secondary | ICD-10-CM | POA: Insufficient documentation

## 2022-12-29 LAB — COMPREHENSIVE METABOLIC PANEL
ALT: 14 U/L (ref 0–44)
AST: 21 U/L (ref 15–41)
Albumin: 3.9 g/dL (ref 3.5–5.0)
Alkaline Phosphatase: 60 U/L (ref 38–126)
Anion gap: 8 (ref 5–15)
BUN: 11 mg/dL (ref 6–20)
CO2: 23 mmol/L (ref 22–32)
Calcium: 9 mg/dL (ref 8.9–10.3)
Chloride: 105 mmol/L (ref 98–111)
Creatinine, Ser: 1.07 mg/dL (ref 0.61–1.24)
GFR, Estimated: >60 mL/min (ref >60)
Glucose, Bld: 132 mg/dL — ABNORMAL HIGH (ref 70–99)
Potassium: 3.6 mmol/L (ref 3.5–5.1)
Sodium: 136 mmol/L (ref 135–145)
Total Bilirubin: 1 mg/dL (ref <1.2)
Total Protein: 7.1 g/dL (ref 6.5–8.1)

## 2022-12-29 LAB — URINALYSIS, ROUTINE W REFLEX MICROSCOPIC
Bilirubin Urine: NEGATIVE
Glucose, UA: NEGATIVE mg/dL
Hgb urine dipstick: NEGATIVE
Ketones, ur: NEGATIVE mg/dL
Leukocytes,Ua: NEGATIVE
Nitrite: NEGATIVE
Protein, ur: NEGATIVE mg/dL
Specific Gravity, Urine: 1.026 (ref 1.005–1.030)
pH: 6 (ref 5.0–8.0)

## 2022-12-29 LAB — CBC
HCT: 39.2 % (ref 39.0–52.0)
Hemoglobin: 13.2 g/dL (ref 13.0–17.0)
MCH: 30.4 pg (ref 26.0–34.0)
MCHC: 33.7 g/dL (ref 30.0–36.0)
MCV: 90.3 fL (ref 80.0–100.0)
Platelets: 138 10*3/uL — ABNORMAL LOW (ref 150–400)
RBC: 4.34 MIL/uL (ref 4.22–5.81)
RDW: 12.6 % (ref 11.5–15.5)
WBC: 4.9 10*3/uL (ref 4.0–10.5)
nRBC: 0 % (ref 0.0–0.2)

## 2022-12-29 NOTE — ED Notes (Signed)
Patient approached this tech in the lobby stating "I am checking out- I have work tomorrow and cannot wait any longer." Patient removed off the floor.

## 2022-12-29 NOTE — ED Triage Notes (Signed)
The pt reports that  he has had some penis pain for 2-3 days  he was here last pm but left to attend his birthday party  no urinary difficulty

## 2023-01-11 ENCOUNTER — Emergency Department (HOSPITAL_COMMUNITY): Payer: Self-pay

## 2023-01-11 ENCOUNTER — Emergency Department (HOSPITAL_COMMUNITY)
Admission: EM | Admit: 2023-01-11 | Discharge: 2023-01-11 | Disposition: A | Discharge status: Home / Self Care | Payer: Self-pay | Attending: Emergency Medicine | Admitting: Emergency Medicine

## 2023-01-11 DIAGNOSIS — I861 Scrotal varices: Secondary | ICD-10-CM | POA: Insufficient documentation

## 2023-01-11 DIAGNOSIS — N50811 Right testicular pain: Secondary | ICD-10-CM | POA: Insufficient documentation

## 2023-01-11 DIAGNOSIS — N50812 Left testicular pain: Secondary | ICD-10-CM | POA: Insufficient documentation

## 2023-01-11 LAB — URINALYSIS, ROUTINE W REFLEX MICROSCOPIC
Bilirubin Urine: NEGATIVE
Glucose, UA: NEGATIVE mg/dL
Hgb urine dipstick: NEGATIVE
Ketones, ur: NEGATIVE mg/dL
Leukocytes,Ua: NEGATIVE
Nitrite: NEGATIVE
Protein, ur: NEGATIVE mg/dL
Specific Gravity, Urine: 1.018 (ref 1.005–1.030)
pH: 6 (ref 5.0–8.0)

## 2023-01-11 LAB — COMPREHENSIVE METABOLIC PANEL
ALT: 15 U/L (ref 0–44)
AST: 24 U/L (ref 15–41)
Albumin: 4.2 g/dL (ref 3.5–5.0)
Alkaline Phosphatase: 71 U/L (ref 38–126)
Anion gap: 11 (ref 5–15)
BUN: 13 mg/dL (ref 6–20)
CO2: 24 mmol/L (ref 22–32)
Calcium: 9.4 mg/dL (ref 8.9–10.3)
Chloride: 101 mmol/L (ref 98–111)
Creatinine, Ser: 1.04 mg/dL (ref 0.61–1.24)
GFR, Estimated: >60 mL/min (ref >60)
Glucose, Bld: 116 mg/dL — ABNORMAL HIGH (ref 70–99)
Potassium: 3.9 mmol/L (ref 3.5–5.1)
Sodium: 136 mmol/L (ref 135–145)
Total Bilirubin: 0.9 mg/dL (ref <1.2)
Total Protein: 7.9 g/dL (ref 6.5–8.1)

## 2023-01-11 LAB — CBC
HCT: 42.7 % (ref 39.0–52.0)
Hemoglobin: 14.2 g/dL (ref 13.0–17.0)
MCH: 30.7 pg (ref 26.0–34.0)
MCHC: 33.3 g/dL (ref 30.0–36.0)
MCV: 92.4 fL (ref 80.0–100.0)
Platelets: 183 10*3/uL (ref 150–400)
RBC: 4.62 MIL/uL (ref 4.22–5.81)
RDW: 12.9 % (ref 11.5–15.5)
WBC: 8 10*3/uL (ref 4.0–10.5)
nRBC: 0 % (ref 0.0–0.2)

## 2023-01-11 LAB — LIPASE, BLOOD: Lipase: 31 U/L (ref 11–51)

## 2023-01-11 NOTE — ED Notes (Signed)
Pt d/c home per EDP order, discharge summary reviewed, verbalize understanding. Ambulatory off unit NAD. Refusing discharge vitals.

## 2023-01-11 NOTE — ED Provider Triage Note (Signed)
Emergency Medicine Provider Triage Evaluation Note  FINNEAS STENGEL , a 32 y.o. male  was evaluated in triage.  Pt complains of with intermittent abdominal pain, rectal pain, groin/testicle pain for 3 weeks.  Seen here recently but left without being seen.  Reports no penile discharge, has not been sexually active in 6 months.  He reports that he had some cellulitis of the groin a few months ago but treated with antibiotics.  He denies any nausea, vomiting.  Review of Systems  Positive: Abdominal pain, testicle pain Negative: Nausea, vomiting, diarrhea  Physical Exam  BP 119/71 (BP Location: Right Arm)   Pulse 75   Temp 98.2 F (36.8 C) (Oral)   Resp 16   SpO2 100%  Gen:   Awake, no distress   Resp:  Normal effort  MSK:   Moves extremities without difficulty  Other:  Normal external appearance of testes, no focal right lower quadrant tenderness or McBurney's point tenderness, overall benign abdominal exam  Medical Decision Making  Medically screening exam initiated at 2:08 PM.  Appropriate orders placed.  Jazion DEVARIO TISON was informed that the remainder of the evaluation will be completed by another provider, this initial triage assessment does not replace that evaluation, and the importance of remaining in the ED until their evaluation is complete.  Workup initiated in triage    Olene Floss, PA-C 01/11/23 1410

## 2023-01-11 NOTE — ED Provider Notes (Signed)
[place] EMERGENCY DEPARTMENT AT [facility] Provider Note   CSN: [id number] Arrival date & time: 01/11/23  1212     History  Chief Complaint  Patient presents with   Abdominal Pain    Sean Stafford is a 32 y.o. male Pt complains of with intermittent abdominal pain, rectal pain, groin/testicle pain for 3 weeks.  Seen here recently but left without being seen.  Reports no penile discharge, has not been sexually active in 6 months.  He reports that he had some cellulitis of the groin a few months ago but treated with antibiotics.  He denies any nausea, vomiting.    Abdominal Pain      Home Medications Prior to Admission medications   Medication Sig Start Date End Date Taking? Authorizing Provider  amoxicillin-clavulanate (AUGMENTIN) 875-125 MG tablet Take 1 tablet by mouth every 12 (twelve) hours. 12/06/20   Palumbo, April, MD  bacitracin ointment Apply 1 Application topically 2 (two) times daily. 09/28/22   Charlynne Pander, MD  doxycycline (VIBRAMYCIN) 100 MG capsule Take 1 capsule (100 mg total) by mouth 2 (two) times daily. One po bid x 7 days 09/28/22   Charlynne Pander, MD  Guaifenesin 1200 MG TB12 Take 1 tablet (1,200 mg total) by mouth 2 (two) times daily. 12/11/16   Lawyer, Cristal Deer, PA-C  promethazine-dextromethorphan (PROMETHAZINE-DM) 6.25-15 MG/5ML syrup Take 5 mLs by mouth 4 (four) times daily as needed for cough. 12/11/16   Charlestine Night, PA-C      Allergies    Patient has no known allergies.    Review of Systems   Review of Systems  Gastrointestinal:  Positive for abdominal pain.  All other systems reviewed and are negative.   Physical Exam Updated Vital Signs BP 119/71 (BP Location: Right Arm)   Pulse 75   Temp 98.2 F (36.8 C) (Oral)   Resp 16   SpO2 100%  Physical Exam Vitals and nursing note reviewed.  Constitutional:      General: He is not in acute distress.    Appearance: Normal appearance.  HENT:     Head:  Normocephalic and atraumatic.  Eyes:     General:        Right eye: No discharge.        Left eye: No discharge.  Cardiovascular:     Rate and Rhythm: Normal rate and regular rhythm.  Pulmonary:     Effort: Pulmonary effort is normal. No respiratory distress.  Genitourinary:    Comments: Overall normal appearence of bilateral testes, no high riding appearance, no unilateral swelling, intact cremasteric reflex bilaterally Musculoskeletal:        General: No deformity.  Skin:    General: Skin is warm and dry.  Neurological:     Mental Status: He is alert and oriented to person, place, and time.  Psychiatric:        Mood and Affect: Mood normal.        Behavior: Behavior normal.     ED Results / Procedures / Treatments   Labs (all labs ordered are listed, but only abnormal results are displayed) Labs Reviewed  COMPREHENSIVE METABOLIC PANEL - Abnormal; Notable for the following components:      Result Value   Glucose, Bld 116 (*)    All other components within normal limits  LIPASE, BLOOD  CBC  URINALYSIS, ROUTINE W REFLEX MICROSCOPIC  GC/CHLAMYDIA PROBE AMP () NOT AT [facility]    EKG None  Radiology US SCROTUM W/DOPPLER  Result Date: 01/11/2023 CLINICAL DATA:  Bilateral testicular pain EXAM: SCROTAL ULTRASOUND DOPPLER ULTRASOUND OF THE TESTICLES TECHNIQUE: Complete ultrasound examination of the testicles, epididymis, and other scrotal structures was performed. Color and spectral Doppler ultrasound were also utilized to evaluate blood flow to the testicles. COMPARISON:  None Available. FINDINGS: Right testicle Measurements: 4.7 x 2.4 x 3.1 cm. No mass or microlithiasis visualized. Left testicle Measurements: 4.6 x 3.0 x 3.3 cm. No mass or microlithiasis visualized. Right epididymis:  Normal in size and appearance. Left epididymis:  Normal in size and appearance. Hydrocele:  None visualized. Varicocele:  Left-greater-than-right varicoceles. Pulsed Doppler interrogation of  both testes demonstrates normal low resistance arterial and venous waveforms bilaterally. IMPRESSION: 1. No evidence for testicular torsion. 2. Left-greater-than-right varicoceles. Electronically Signed   By: Annia Belt M.D.   On: 01/11/2023 16:23    Procedures Procedures    Medications Ordered in ED Medications - No data to display  ED Course/ Medical Decision Making/ A&P                                 Medical Decision Making Amount and/or Complexity of Data Reviewed Labs: ordered. Radiology: ordered.   This patient is a 32 y.o. male  who presents to the ED for concern of scrotum pain, abdominal pain.   Differential diagnoses prior to evaluation: The emergent differential diagnosis includes, but is not limited to,  The causes of generalized abdominal pain include but are not limited to AAA, mesenteric ischemia, appendicitis, diverticulitis, DKA, gastritis, gastroenteritis, AMI, nephrolithiasis, pancreatitis, peritonitis, adrenal insufficiency,lead poisoning, iron toxicity, intestinal ischemia, constipation, UTI,SBO/LBO, splenic rupture, biliary disease, IBD, IBS, PUD, or hepatitis, hydrocele, varicocele, testicular torsion, epididymitis, epididymal appendage injury, inguinal hernia, traumatic injury of testicle, STI, inguinal lymphadenopathy versus other . This is not an exhaustive differential.   Past Medical History / Co-morbidities / Social History: Overall noncontributory  Additional history: Chart reviewed. Pertinent results include: reviewed labwork, imaging from recent previous ED visits  Physical Exam: Physical exam performed. The pertinent findings include: Overall normal appearence of bilateral testes, no high riding appearance, no unilateral swelling, intact cremasteric reflex bilaterally  Lab Tests/Imaging studies: I personally interpreted labs/imaging and the pertinent results include: CBC unremarkable, CMP unremarkable, UA unremarkable, lipase normal.  GC chlamydia  is pending but overall low clinical suspicion based on physical exam.  I do feel interpreted scrotum ultrasound with Doppler which shows bilateral varicoceles left greater than right.. I agree with the radiologist interpretation.    Medications: Encouraged ibuprofen, Tylenol, elevation of testes, close follow-up with urology as needed for further evaluation   Disposition: After consideration of the diagnostic results and the patients response to treatment, I feel that patient is stable for discharge with plan as above .   emergency department workup does not suggest an emergent condition requiring admission or immediate intervention beyond what has been performed at this time. The plan is: as above. The patient is safe for discharge and has been instructed to return immediately for worsening symptoms, change in symptoms or any other concerns.  Final Clinical Impression(s) / ED Diagnoses Final diagnoses:  Pain in both testicles  Varicocele    Rx / DC Orders ED Discharge Orders     None         Olene Floss, PA-C 01/11/23 1746    Lonell Grandchild, MD 01/11/23 2252

## 2023-01-11 NOTE — Discharge Instructions (Addendum)
Please use Tylenol or ibuprofen for pain.  You may use 600 mg ibuprofen every 6 hours or 1000 mg of Tylenol every 6 hours.  You may choose to alternate between the 2.  This would be most effective.  Not to exceed 4 g of Tylenol within 24 hours.  Not to exceed 3200 mg ibuprofen 24 hours.  Keeping the scrotum elevated may help to relieve some of the pressure on your varicocele, but generally they do not recommend surgical management unless the symptoms become severe.  I do recommend following up with a urologist for further evaluation if you continue to have ongoing pain

## 2023-01-11 NOTE — ED Triage Notes (Signed)
Pt to ED POV from home. Pt c/o RLQ abdominal pain, rectal pain, groin / testicle pain x3 weeks. Pt states he was seen here for same recently but left prior to being seen. Pt denies N/V/D. Pt denies any urinary s/s.

## 2023-01-12 LAB — GC/CHLAMYDIA PROBE AMP (~~LOC~~) NOT AT ARMC
Chlamydia: NEGATIVE
Comment: NEGATIVE
Comment: NORMAL
Neisseria Gonorrhea: NEGATIVE

## 2023-02-22 ENCOUNTER — Emergency Department (HOSPITAL_COMMUNITY): Admission: EM | Admit: 2023-02-22 | Discharge: 2023-02-22 | Discharge status: Against Medical Advice | Payer: Self-pay

## 2023-02-22 NOTE — ED Notes (Signed)
Called pt x3 for triage, no response. 

## 2023-02-23 ENCOUNTER — Emergency Department (HOSPITAL_COMMUNITY)
Admission: EM | Admit: 2023-02-23 | Discharge: 2023-02-23 | Disposition: A | Discharge status: Home / Self Care | Payer: Self-pay | Attending: Emergency Medicine | Admitting: Emergency Medicine

## 2023-02-23 ENCOUNTER — Other Ambulatory Visit: Payer: Self-pay

## 2023-02-23 DIAGNOSIS — Z0000 Encounter for general adult medical examination without abnormal findings: Secondary | ICD-10-CM | POA: Insufficient documentation

## 2023-02-23 NOTE — ED Provider Notes (Signed)
 [place] EMERGENCY DEPARTMENT AT [facility] Provider Note   CSN: [id number] Arrival date & time: 02/23/23  1004     History  No chief complaint on file.   Sean Stafford is a 33 y.o. male who presents for  annual checkup.  He states he is entirely without symptoms.   I feel good doc.  He is requesting annual blood work, STI panel.  Was seen 2 months ago for similar symptoms.  Workup unremarkable.  He has no new sex partners, not currently sexually active.  HPI     Home Medications Prior to Admission medications   Medication Sig Start Date End Date Taking? Authorizing Provider  amoxicillin -clavulanate (AUGMENTIN ) 875-125 MG tablet Take 1 tablet by mouth every 12 (twelve) hours. 12/06/20   Palumbo, April, MD  bacitracin  ointment Apply 1 Application topically 2 (two) times daily. 09/28/22   Patt Alm Macho, MD  doxycycline  (VIBRAMYCIN ) 100 MG capsule Take 1 capsule (100 mg total) by mouth 2 (two) times daily. One po bid x 7 days 09/28/22   Patt Alm Macho, MD  Guaifenesin  1200 MG TB12 Take 1 tablet (1,200 mg total) by mouth 2 (two) times daily. 12/11/16   Lawyer, Lonni, PA-C  promethazine -dextromethorphan (PROMETHAZINE -DM) 6.25-15 MG/5ML syrup Take 5 mLs by mouth 4 (four) times daily as needed for cough. 12/11/16   Tracey Lonni, PA-C      Allergies    Patient has no known allergies.    Review of Systems   Review of Systems  Genitourinary:  Negative for penile discharge.    Physical Exam Updated Vital Signs BP 122/82 (BP Location: Right Arm)   Pulse 79   Temp 98.1 F (36.7 C)   Resp 18   SpO2 100%  Physical Exam Vitals and nursing note reviewed.  Constitutional:      General: He is not in acute distress.    Appearance: He is well-developed.  HENT:     Head: Normocephalic and atraumatic.  Eyes:     Conjunctiva/sclera: Conjunctivae normal.  Cardiovascular:     Rate and Rhythm: Normal rate and regular rhythm.     Heart sounds: No  murmur heard. Pulmonary:     Effort: Pulmonary effort is normal. No respiratory distress.     Breath sounds: Normal breath sounds.  Abdominal:     Palpations: Abdomen is soft.     Tenderness: There is no abdominal tenderness.  Musculoskeletal:        General: No swelling.     Cervical back: Neck supple.  Skin:    General: Skin is warm and dry.     Capillary Refill: Capillary refill takes less than 2 seconds.  Neurological:     Mental Status: He is alert.  Psychiatric:        Mood and Affect: Mood normal.     ED Results / Procedures / Treatments   Labs (all labs ordered are listed, but only abnormal results are displayed) Labs Reviewed - No data to display  EKG None  Radiology No results found.  Procedures Procedures    Medications Ordered in ED Medications - No data to display  ED Course/ Medical Decision Making/ A&P                                 Medical Decision Making  This patient presents to the ED with chief complaint(s) of  annual checkup.  The complaint involves an extensive  differential diagnosis and also carries with it a high risk of complications and morbidity.   pertinent past medical history as listed in HPI   Additional history obtained:  Records reviewed previous admission documents  Initial Assessment:   Patient with normal vital signs, nontoxic-appearing, entirely asymptomatic presenting for  annual checkup and routine blood work.  He does not currently follow with a primary care provider.  .  I have offered him blood work, urine and STI panel, however informed him I believe that this will be unremarkable that I have no clinical reason to order these labs. I have discussed that he would be best served following up with PCP.  Patient is agreeable.  I offered patient discharge paperwork including primary care resources.  He declined and has left the ED.  Independent ECG interpretation:  none  Independent labs interpretation:  The  following labs were independently interpreted:  none  Independent visualization and interpretation of imaging: none  Treatment and Reassessment: none  Consultations obtained:   none  Disposition:   Patient discharged home.  Encouraged to follow-up with primary care provider.  Declined discharge paperwork and primary care resources.  Declined lab work after learning it will take another hour or two and that he would be best served follows with PCP.  The patient has been appropriately medically screened and/or stabilized in the ED. I have low suspicion for any other emergent medical condition which would require further screening, evaluation or treatment in the ED or require inpatient management. At time of discharge the patient is hemodynamically stable and in no acute distress. I have discussed work-up results and diagnosis with patient and answered all questions. Patient is agreeable with discharge plan. We discussed strict return precautions for returning to the emergency department and they verbalized understanding.     Social Determinants of Health:   Patient's impaired access to primary care  increases the complexity of managing their presentation  This note was dictated with voice recognition software.  Despite best efforts at proofreading, errors may have occurred which can change the documentation meaning.          Final Clinical Impression(s) / ED Diagnoses Final diagnoses:  Routine check-up    Rx / DC Orders ED Discharge Orders     None         Donnajean Lynwood DEL, PA-C 02/23/23 1316    Emil Share, DO 02/23/23 1508

## 2023-02-23 NOTE — Discharge Instructions (Signed)
 Left without discharge paper work or PCP resources

## 2023-02-23 NOTE — ED Triage Notes (Signed)
 Patient reports he wants a "full body check up" and has lip discoloration x 2 weeks. States was here two months ago for a check up and is back again because it's the new year and he's trying to stay on top of his health.

## 2023-03-22 ENCOUNTER — Other Ambulatory Visit: Payer: Self-pay

## 2023-03-22 ENCOUNTER — Encounter (HOSPITAL_COMMUNITY): Payer: Self-pay

## 2023-03-22 ENCOUNTER — Emergency Department (HOSPITAL_COMMUNITY)
Admission: EM | Admit: 2023-03-22 | Discharge: 2023-03-22 | Disposition: A | Discharge status: Home / Self Care | Payer: Self-pay | Attending: Emergency Medicine | Admitting: Emergency Medicine

## 2023-03-22 DIAGNOSIS — L03315 Cellulitis of perineum: Secondary | ICD-10-CM | POA: Insufficient documentation

## 2023-03-22 DIAGNOSIS — L03818 Cellulitis of other sites: Secondary | ICD-10-CM

## 2023-03-22 LAB — CBC WITH DIFFERENTIAL/PLATELET
Abs Immature Granulocytes: 0.01 10*3/uL (ref 0.00–0.07)
Basophils Absolute: 0.1 10*3/uL (ref 0.0–0.1)
Basophils Relative: 1 %
Eosinophils Absolute: 0.2 10*3/uL (ref 0.0–0.5)
Eosinophils Relative: 3 %
HCT: 40.9 % (ref 39.0–52.0)
Hemoglobin: 13.8 g/dL (ref 13.0–17.0)
Immature Granulocytes: 0 %
Lymphocytes Relative: 26 %
Lymphs Abs: 1.5 10*3/uL (ref 0.7–4.0)
MCH: 31.1 pg (ref 26.0–34.0)
MCHC: 33.7 g/dL (ref 30.0–36.0)
MCV: 92.1 fL (ref 80.0–100.0)
Monocytes Absolute: 0.9 10*3/uL (ref 0.1–1.0)
Monocytes Relative: 15 %
Neutro Abs: 3.3 10*3/uL (ref 1.7–7.7)
Neutrophils Relative %: 55 %
Platelets: 139 10*3/uL — ABNORMAL LOW (ref 150–400)
RBC: 4.44 MIL/uL (ref 4.22–5.81)
RDW: 13.3 % (ref 11.5–15.5)
WBC: 5.9 10*3/uL (ref 4.0–10.5)
nRBC: 0 % (ref 0.0–0.2)

## 2023-03-22 LAB — COMPREHENSIVE METABOLIC PANEL
ALT: 18 U/L (ref 0–44)
AST: 25 U/L (ref 15–41)
Albumin: 3.8 g/dL (ref 3.5–5.0)
Alkaline Phosphatase: 68 U/L (ref 38–126)
Anion gap: 9 (ref 5–15)
BUN: 9 mg/dL (ref 6–20)
CO2: 24 mmol/L (ref 22–32)
Calcium: 9 mg/dL (ref 8.9–10.3)
Chloride: 102 mmol/L (ref 98–111)
Creatinine, Ser: 1.07 mg/dL (ref 0.61–1.24)
GFR, Estimated: >60 mL/min (ref >60)
Glucose, Bld: 100 mg/dL — ABNORMAL HIGH (ref 70–99)
Potassium: 3.7 mmol/L (ref 3.5–5.1)
Sodium: 135 mmol/L (ref 135–145)
Total Bilirubin: 0.9 mg/dL (ref 0.0–1.2)
Total Protein: 6.9 g/dL (ref 6.5–8.1)

## 2023-03-22 MED ORDER — DOXYCYCLINE HYCLATE 100 MG PO CAPS: 100.0000 mg | ORAL_CAPSULE | Freq: Two times a day (BID) | ORAL | 0 refills | Status: AC

## 2023-03-22 NOTE — ED Notes (Signed)
Pt not answering for vital recheck

## 2023-03-22 NOTE — ED Provider Notes (Signed)
[place] EMERGENCY DEPARTMENT AT [facility] Provider Note   CSN: [id number] Arrival date & time: 03/22/23  7829     History  Chief Complaint  Patient presents with   Cellulitis    Sean Stafford is a 33 y.o. male with no significant past medical history who presents to the ED today for possible cellulitis. Patient reports history of cellulitis around his rectum in the past and thinks he has it again.  He has noticed bright red streaks of blood on the toilet paper occasionally when wiping after having a bowel movement for the past several days. He also has intermittent pain at the anus. Patient feels the same as he did before when he had cellulitis. Denies fevers, discharge, abscess, anal sex, or putting anything in the anus. He has tried bacitracin ointment with some improvement.    Home Medications Prior to Admission medications   Medication Sig Start Date End Date Taking? Authorizing Provider  amoxicillin-clavulanate (AUGMENTIN) 875-125 MG tablet Take 1 tablet by mouth every 12 (twelve) hours. 12/06/20   Palumbo, April, MD  bacitracin ointment Apply 1 Application topically 2 (two) times daily. 09/28/22   Charlynne Pander, MD      Allergies    Patient has no known allergies.    Review of Systems   Review of Systems  Skin:  Positive for wound.  All other systems reviewed and are negative.   Physical Exam Updated Vital Signs BP 133/74 (BP Location: Right Arm)   Pulse 67   Temp 97.8 F (36.6 C) (Oral)   Resp 16   SpO2 100%  Physical Exam Vitals and nursing note reviewed. Exam conducted with a chaperone present.  Constitutional:      General: He is not in acute distress.    Appearance: Normal appearance.  HENT:     Head: Normocephalic and atraumatic.     Mouth/Throat:     Mouth: Mucous membranes are moist.  Eyes:     Conjunctiva/sclera: Conjunctivae normal.     Pupils: Pupils are equal, round, and reactive to light.  Cardiovascular:     Rate and  Rhythm: Normal rate and regular rhythm.     Pulses: Normal pulses.  Pulmonary:     Effort: Pulmonary effort is normal.     Breath sounds: Normal breath sounds.  Abdominal:     Palpations: Abdomen is soft.     Tenderness: There is no abdominal tenderness.  Genitourinary:    Comments: Tenderness to palpation around the anus without hemorrhoids present Skin:    General: Skin is warm and dry.     Findings: No rash.  Neurological:     General: No focal deficit present.     Mental Status: He is alert.  Psychiatric:        Mood and Affect: Mood normal.        Behavior: Behavior normal.    ED Results / Procedures / Treatments   Labs (all labs ordered are listed, but only abnormal results are displayed) Labs Reviewed  COMPREHENSIVE METABOLIC PANEL - Abnormal; Notable for the following components:      Result Value   Glucose, Bld 100 (*)    All other components within normal limits  CBC WITH DIFFERENTIAL/PLATELET - Abnormal; Notable for the following components:   Platelets 139 (*)    All other components within normal limits    EKG None  Radiology No results found.  Procedures Procedures: not indicated.   Medications Ordered in ED Medications -  No data to display  ED Course/ Medical Decision Making/ A&P                                 Medical Decision Making Amount and/or Complexity of Data Reviewed Labs: ordered.  Risk Prescription drug management.   This patient presents to the ED for concern of possible cellulitis, this involves an extensive number of treatment options, and is a complaint that carries with it a high risk of complications and morbidity.   Differential diagnosis includes: Anal fissure, abscess, fistula, cellulitis, etc.   Comorbidities  See HPI above   Additional History  Additional history obtained from prior records.   Lab Tests  I ordered and personally interpreted labs.  The pertinent results include:   CMP and CBC are within  normal limits - no acute electrolyte derangement, AKI, infection, or anemia   Problem List / ED Course / Critical Interventions / Medication Management  Pain and tenderness around anus for the past several days with some bright red blood on toilet paper after bowel movements. He denies abdominal pain, N/V/D. No blood in the stools. Denies changes to bowel habits. No visible/palpable hemorrhoids on exam. There is tenderness to palpation at the skin surrounding the anus. No fluctuant mass appreciated. Discussed physical exam findings with patient. Informed him that he could have an anal fissure based off the blood when wiping. Advised warm sitz baths. Will treat prophylactically for cellulitis as well. Prescription sent to the pharmacy. I have reviewed the patients home medicines and have made adjustments as needed   Social Determinants of Health  Access to healthcare.   Test / Admission - Considered  Patient is stable and safe for discharge home. Return precautions provided.        Final Clinical Impression(s) / ED Diagnoses Final diagnoses:  Cellulitis of other specified site    Rx / DC Orders ED Discharge Orders     None         Maxwell Marion, PA-C 03/23/23 2328    Ernie Avena, MD 03/28/23 [phone]

## 2023-03-22 NOTE — ED Notes (Signed)
Pt refused repeat vital signs.

## 2023-03-22 NOTE — Discharge Instructions (Addendum)
As discussed, continue using the bacitracin ointment and try taking warm sitz bath's for further relief.  I have sent a prescription of doxycycline to your pharmacy.  Take this twice a day for the next 7 days for infection prevention.  Take this medication with food as it can cause nausea/vomiting taking on empty stomach.  Follow up with your primary care provider in 1 week for reevaluation of your symptoms.  Get help right away if: You notice red streaks of the skin coming from the infected area. You notice the skin turns purple or black and falls off.

## 2023-03-22 NOTE — ED Triage Notes (Signed)
PT arrives via POV. Pt reports he is concerned that he may have cellulitis around his anus. States this also occurred back in August. Pt is AxOx4.
